# Patient Record
Sex: Female | Born: 1970 | Race: Black or African American | Hispanic: No | Marital: Married | Smoking: Never smoker
Health system: Southern US, Community
[De-identification: ages and names within clinical notes are randomized; demographics above are authoritative.]

## PROBLEM LIST (undated history)

## (undated) DIAGNOSIS — R42 Dizziness and giddiness: Secondary | ICD-10-CM

## (undated) HISTORY — PX: TUBAL LIGATION: SHX77

---

## 1998-09-18 ENCOUNTER — Encounter: Admission: RE | Admit: 1998-09-18 | Discharge: 1998-10-03 | Payer: Self-pay | Admitting: Family Medicine

## 2000-01-11 ENCOUNTER — Emergency Department (HOSPITAL_COMMUNITY): Admission: EM | Admit: 2000-01-11 | Discharge: 2000-01-11 | Payer: Self-pay | Admitting: Emergency Medicine

## 2002-02-19 ENCOUNTER — Encounter: Payer: Self-pay | Admitting: Emergency Medicine

## 2002-02-19 ENCOUNTER — Emergency Department (HOSPITAL_COMMUNITY): Admission: EM | Admit: 2002-02-19 | Discharge: 2002-02-19 | Payer: Self-pay | Admitting: Emergency Medicine

## 2003-06-22 ENCOUNTER — Other Ambulatory Visit: Admission: RE | Admit: 2003-06-22 | Discharge: 2003-06-22 | Payer: Self-pay | Admitting: Family Medicine

## 2003-06-28 ENCOUNTER — Encounter: Admission: RE | Admit: 2003-06-28 | Discharge: 2003-06-28 | Payer: Self-pay | Admitting: Family Medicine

## 2004-03-03 ENCOUNTER — Emergency Department (HOSPITAL_COMMUNITY): Admission: EM | Admit: 2004-03-03 | Discharge: 2004-03-03 | Payer: Self-pay | Admitting: Family Medicine

## 2004-03-29 ENCOUNTER — Encounter: Admission: RE | Admit: 2004-03-29 | Discharge: 2004-03-29 | Payer: Self-pay | Admitting: Family Medicine

## 2004-04-10 ENCOUNTER — Emergency Department (HOSPITAL_COMMUNITY): Admission: EM | Admit: 2004-04-10 | Discharge: 2004-04-11 | Payer: Self-pay | Admitting: Family Medicine

## 2005-04-23 ENCOUNTER — Emergency Department (HOSPITAL_COMMUNITY): Admission: EM | Admit: 2005-04-23 | Discharge: 2005-04-23 | Payer: Self-pay | Admitting: Emergency Medicine

## 2005-11-21 ENCOUNTER — Emergency Department (HOSPITAL_COMMUNITY): Admission: EM | Admit: 2005-11-21 | Discharge: 2005-11-21 | Payer: Self-pay | Admitting: Family Medicine

## 2006-07-23 ENCOUNTER — Emergency Department (HOSPITAL_COMMUNITY): Admission: EM | Admit: 2006-07-23 | Discharge: 2006-07-23 | Payer: Self-pay | Admitting: Family Medicine

## 2011-06-12 ENCOUNTER — Other Ambulatory Visit: Payer: Self-pay | Admitting: Internal Medicine

## 2011-06-18 ENCOUNTER — Ambulatory Visit
Admission: RE | Admit: 2011-06-18 | Discharge: 2011-06-18 | Disposition: A | Discharge status: Home / Self Care | Payer: Self-pay | Source: Ambulatory Visit | Attending: Internal Medicine | Admitting: Internal Medicine

## 2011-06-18 ENCOUNTER — Other Ambulatory Visit: Payer: Self-pay | Admitting: Internal Medicine

## 2011-07-02 ENCOUNTER — Ambulatory Visit
Admission: RE | Admit: 2011-07-02 | Discharge: 2011-07-02 | Disposition: A | Discharge status: Home / Self Care | Payer: Managed Care, Other (non HMO) | Source: Ambulatory Visit | Attending: Internal Medicine | Admitting: Internal Medicine

## 2011-07-02 ENCOUNTER — Other Ambulatory Visit: Payer: Self-pay

## 2011-12-26 ENCOUNTER — Telehealth: Payer: Self-pay | Admitting: "Endocrinology

## 2012-01-13 ENCOUNTER — Encounter (HOSPITAL_COMMUNITY): Payer: Self-pay

## 2012-01-13 ENCOUNTER — Emergency Department (HOSPITAL_COMMUNITY)
Admission: EM | Admit: 2012-01-13 | Discharge: 2012-01-13 | Disposition: A | Discharge status: Home / Self Care | Payer: Managed Care, Other (non HMO) | Source: Home / Self Care | Attending: Emergency Medicine | Admitting: Emergency Medicine

## 2012-01-13 DIAGNOSIS — N3000 Acute cystitis without hematuria: Secondary | ICD-10-CM

## 2012-01-13 LAB — POCT URINALYSIS DIP (DEVICE)
Bilirubin Urine: NEGATIVE
Glucose, UA: NEGATIVE mg/dL
Ketones, ur: NEGATIVE mg/dL
Nitrite: POSITIVE — AB
Protein, ur: 30 mg/dL — AB
Specific Gravity, Urine: 1.025 (ref 1.005–1.030)
Urobilinogen, UA: 0.2 mg/dL (ref 0.0–1.0)
pH: 5.5 (ref 5.0–8.0)

## 2012-01-13 LAB — POCT PREGNANCY, URINE: Preg Test, Ur: NEGATIVE

## 2012-01-13 MED ORDER — CEPHALEXIN 500 MG PO CAPS: 500.0000 mg | ORAL_CAPSULE | Freq: Three times a day (TID) | ORAL | Status: AC

## 2012-01-13 MED ORDER — CEPHALEXIN 500 MG PO CAPS: 500.0000 mg | ORAL_CAPSULE | Freq: Three times a day (TID) | ORAL | Status: DC

## 2012-01-13 NOTE — ED Notes (Addendum)
C/o painful UA for past 24-48 h; noted blood this PM, concerned for poss UTI; denies any concern for STD

## 2012-01-13 NOTE — ED Provider Notes (Signed)
Chief Complaint  Patient presents with  . Dysuria    History of Present Illness:   Bonnie Weber is a 41 year old female who has had a two-day history of frequency, dysuria, and blood in the urine. She denies any fever, chills, nausea, vomiting, back pain, or lower abdominal pain. She denies any GYN complaints. Her last menstrual period was July 27. She is sexually active and has had a tubal ligation. She has had urinary tract infections before, but the last one was about 2 years ago.  Review of Systems:  Other than noted above, the patient denies any of the following symptoms: General:  No fevers, chills, sweats, aches, or fatigue. GI:  No abdominal pain, back pain, nausea, vomiting, diarrhea, or constipation. GU:  No dysuria, frequency, urgency, hematuria, or incontinence. GYN:  No discharge, itching, vulvar pain or lesions, pelvic pain, or abnormal vaginal bleeding.  PMFSH:  Past medical history, family history, social history, meds, and allergies were reviewed.  Physical Exam:   Vital signs:  BP 114/82  Pulse 90  Temp 98.6 F (37 C) (Oral)  Resp 12  SpO2 99% Gen:  Alert, oriented, in no distress. Lungs:  Clear to auscultation, no wheezes, rales or rhonchi. Heart:  Regular rhythm, no gallop or murmer. Abdomen:  Flat and soft. There was slight suprapubic pain to palpation.  No guarding, or rebound.  No hepato-splenomegaly or mass.  Bowel sounds were normally active.  No hernia. Back:  No CVA tenderness.  Skin:  Clear, warm and dry.  Labs:   Results for orders placed during the hospital encounter of 01/13/12  POCT URINALYSIS DIP (DEVICE)      Component Value Range   Glucose, UA NEGATIVE  NEGATIVE mg/dL   Bilirubin Urine NEGATIVE  NEGATIVE   Ketones, ur NEGATIVE  NEGATIVE mg/dL   Specific Gravity, Urine 1.025  1.005 - 1.030   Hgb urine dipstick LARGE (*) NEGATIVE   pH 5.5  5.0 - 8.0   Protein, ur 30 (*) NEGATIVE mg/dL   Urobilinogen, UA 0.2  0.0 - 1.0 mg/dL   Nitrite POSITIVE (*)  NEGATIVE   Leukocytes, UA SMALL (*) NEGATIVE  POCT PREGNANCY, URINE      Component Value Range   Preg Test, Ur NEGATIVE  NEGATIVE     Other Labs Obtained at Urgent Care Center:  A urine culture was obtained.  Results are pending at this time and we will call about any positive results.  Assessment: The encounter diagnosis was Acute cystitis.   Plan:   1.  The following meds were prescribed:   New Prescriptions   CEPHALEXIN (KEFLEX) 500 MG CAPSULE    Take 1 capsule (500 mg total) by mouth 3 (three) times daily.   2.  The patient was instructed in symptomatic care and handouts were given. 3.  The patient was told to return if becoming worse in any way, if no better in 3 or 4 days, and given some red flag symptoms that would indicate earlier return. 4.  The patient was told to avoid intercourse for 10 days, get extra fluids, and return for a follow up with her primary care doctor at the completion of treatment for a repeat UA and culture.     Reuben Likes, MD 01/13/12 2046

## 2012-01-15 LAB — URINE CULTURE: Colony Count: >=100000

## 2012-01-15 NOTE — ED Notes (Signed)
Urine culture: >100,000 colonies E. Coli. Pt. Adequately treated with Keflex. Vassie Moselle 01/15/2012

## 2012-04-06 NOTE — Telephone Encounter (Signed)
Opened in error

## 2013-05-29 ENCOUNTER — Encounter (HOSPITAL_COMMUNITY): Payer: Self-pay | Admitting: Emergency Medicine

## 2013-05-29 ENCOUNTER — Emergency Department (INDEPENDENT_AMBULATORY_CARE_PROVIDER_SITE_OTHER)
Admission: EM | Admit: 2013-05-29 | Discharge: 2013-05-29 | Disposition: A | Discharge status: Home / Self Care | Payer: Managed Care, Other (non HMO) | Source: Home / Self Care | Attending: Family Medicine | Admitting: Family Medicine

## 2013-05-29 DIAGNOSIS — S01512A Laceration without foreign body of oral cavity, initial encounter: Secondary | ICD-10-CM

## 2013-05-29 DIAGNOSIS — S01501A Unspecified open wound of lip, initial encounter: Secondary | ICD-10-CM

## 2013-05-29 NOTE — ED Provider Notes (Signed)
CSN: [id number]     Arrival date & time 05/29/13  1478 History   First MD Initiated Contact with Patient 05/29/13 [phone]     Chief Complaint  Patient presents with  . Groin Swelling   (Consider location/radiation/quality/duration/timing/severity/associated sxs/prior Treatment) HPI Comments: Patient states she had waxing of her vaginal area done yesterday and during procedure, she suffered a small skin tear on the left side of her vaginal area. She noticed some dries blood in the area today and this concerned her so she presents for evaluation.   The history is provided by the patient.    History reviewed. No pertinent past medical history. History reviewed. No pertinent past surgical history. History reviewed. No pertinent family history. History  Substance Use Topics  . Smoking status: Not on file  . Smokeless tobacco: Not on file  . Alcohol Use: Not on file   OB History   Grav Para Term Preterm Abortions TAB SAB Ect Mult Living                 Review of Systems  All other systems reviewed and are negative.    Allergies  Shellfish allergy  Home Medications  No current outpatient prescriptions on file. BP 131/90  Pulse 84  Temp(Src) 99 F (37.2 C) (Oral)  Resp 18  SpO2 18%  LMP 05/15/2013 Physical Exam  Nursing note and vitals reviewed. Constitutional: She is oriented to person, place, and time. She appears well-developed and well-nourished. No distress.  HENT:  Head: Normocephalic and atraumatic.  Cardiovascular: Normal rate.   Pulmonary/Chest: Effort normal.  Genitourinary:    Pelvic exam was performed with patient supine. There is no rash, tenderness, lesion or injury on the right labia. There is tenderness and injury on the left labia. There is no rash or lesion on the left labia.  Neurological: She is alert and oriented to person, place, and time.  Skin: Skin is warm and dry.  Psychiatric: She has a normal mood and affect. Her behavior is normal.    ED  Course  Procedures (including critical care time) Labs Review Labs Reviewed - No data to display Imaging Review No results found.  EKG Interpretation    Date/Time:    Ventricular Rate:    PR Interval:    QRS Duration:   QT Interval:    QTC Calculation:   R Axis:     Text Interpretation:              MDM  Advised to keep area as clean and as dry as possible with application of a very thin layer of bacitracin over would once a day after bathing until healed. If signs of infection (redeness, swelling, increasing pain or drainage) have area re-evaluated. No waxing until healed.     Jess Barters [place], [place] 05/29/13 1002

## 2013-05-29 NOTE — ED Notes (Signed)
C/o vaginal area is bleeding near her left thigh and labia  States she was getting waxed and when it was pulled away she started bleeding Notice this morning she has gashes on her vagina

## 2013-05-30 NOTE — ED Provider Notes (Signed)
Medical screening examination/treatment/procedure(s) were performed by a resident physician or non-physician practitioner and as the supervising physician I was immediately available for consultation/collaboration.  Donat Humble, MD    Roslin Norwood S Kacin Dancy, MD 05/30/13 0744 

## 2015-10-23 ENCOUNTER — Emergency Department (HOSPITAL_BASED_OUTPATIENT_CLINIC_OR_DEPARTMENT_OTHER)
Admission: EM | Admit: 2015-10-23 | Discharge: 2015-10-23 | Disposition: A | Discharge status: Home / Self Care | Payer: BLUE CROSS/BLUE SHIELD | Attending: Emergency Medicine | Admitting: Emergency Medicine

## 2015-10-23 ENCOUNTER — Encounter (HOSPITAL_BASED_OUTPATIENT_CLINIC_OR_DEPARTMENT_OTHER): Payer: Self-pay | Admitting: *Deleted

## 2015-10-23 DIAGNOSIS — R51 Headache: Secondary | ICD-10-CM | POA: Insufficient documentation

## 2015-10-23 DIAGNOSIS — R55 Syncope and collapse: Secondary | ICD-10-CM | POA: Insufficient documentation

## 2015-10-23 DIAGNOSIS — R109 Unspecified abdominal pain: Secondary | ICD-10-CM | POA: Insufficient documentation

## 2015-10-23 LAB — URINALYSIS, ROUTINE W REFLEX MICROSCOPIC
BILIRUBIN URINE: NEGATIVE
GLUCOSE, UA: NEGATIVE mg/dL
HGB URINE DIPSTICK: NEGATIVE
Ketones, ur: 15 mg/dL — AB
Leukocytes, UA: NEGATIVE
Nitrite: NEGATIVE
PH: 7 (ref 5.0–8.0)
Protein, ur: NEGATIVE mg/dL
SPECIFIC GRAVITY, URINE: 1.019 (ref 1.005–1.030)

## 2015-10-23 LAB — BASIC METABOLIC PANEL
Anion gap: 5 (ref 5–15)
BUN: 12 mg/dL (ref 6–20)
CO2: 25 mmol/L (ref 22–32)
Calcium: 8.6 mg/dL — ABNORMAL LOW (ref 8.9–10.3)
Chloride: 108 mmol/L (ref 101–111)
Creatinine, Ser: 0.75 mg/dL (ref 0.44–1.00)
GFR calc Af Amer: >60 mL/min (ref >60)
GFR calc non Af Amer: >60 mL/min (ref >60)
Glucose, Bld: 108 mg/dL — ABNORMAL HIGH (ref 65–99)
Potassium: 3.4 mmol/L — ABNORMAL LOW (ref 3.5–5.1)
Sodium: 138 mmol/L (ref 135–145)

## 2015-10-23 LAB — CBC WITH DIFFERENTIAL/PLATELET
Basophils Absolute: 0 10*3/uL (ref 0.0–0.1)
Basophils Relative: 0 %
Eosinophils Absolute: 0.1 10*3/uL (ref 0.0–0.7)
Eosinophils Relative: 1 %
HCT: 36.6 % (ref 36.0–46.0)
Hemoglobin: 12.5 g/dL (ref 12.0–15.0)
Lymphocytes Relative: 34 %
Lymphs Abs: 1.6 10*3/uL (ref 0.7–4.0)
MCH: 29.1 pg (ref 26.0–34.0)
MCHC: 34.2 g/dL (ref 30.0–36.0)
MCV: 85.3 fL (ref 78.0–100.0)
Monocytes Absolute: 0.3 10*3/uL (ref 0.1–1.0)
Monocytes Relative: 6 %
Neutro Abs: 2.7 10*3/uL (ref 1.7–7.7)
Neutrophils Relative %: 59 %
Platelets: 191 10*3/uL (ref 150–400)
RBC: 4.29 MIL/uL (ref 3.87–5.11)
RDW: 13 % (ref 11.5–15.5)
WBC: 4.7 10*3/uL (ref 4.0–10.5)

## 2015-10-23 LAB — PREGNANCY, URINE: Preg Test, Ur: NEGATIVE

## 2015-10-23 MED ORDER — ONDANSETRON HCL 4 MG/2ML IJ SOLN
4.0000 mg | Freq: Once | INTRAMUSCULAR | Status: AC
  Administered 2015-10-23: 4 mg via INTRAVENOUS

## 2015-10-23 MED ORDER — ONDANSETRON HCL 4 MG/2ML IJ SOLN
INTRAMUSCULAR | Status: AC
  Administered 2015-10-23: 4 mg via INTRAVENOUS
  Filled 2015-10-23: qty 2

## 2015-10-23 MED ORDER — SODIUM CHLORIDE 0.9 % IV BOLUS (SEPSIS)
1000.0000 mL | Freq: Once | INTRAVENOUS | Status: AC
  Administered 2015-10-23: 1000 mL via INTRAVENOUS

## 2015-10-23 NOTE — ED Provider Notes (Signed)
CSN: [id number]     Arrival date & time 10/23/15  [id number] History   First MD Initiated Contact with Patient 10/23/15 [phone]     Chief Complaint  Patient presents with  . Menstrual Problem     (Consider location/radiation/quality/duration/timing/severity/associated sxs/prior Treatment) Patient is a 45 y.o. female presenting with near-syncope.  Near Syncope This is a new problem. The current episode started today. Associated symptoms include abdominal pain (menstrual cramping), fatigue, headaches, nausea, vertigo and vomiting. Pertinent negatives include no chest pain, chills, congestion, coughing, diaphoresis, fever or weakness. The symptoms are aggravated by standing. She has tried nothing for the symptoms.    History reviewed. No pertinent past medical history. Past Surgical History  Procedure Laterality Date  . Tubal ligation     Family History  Problem Relation Age of Onset  . Heart failure Maternal Grandmother    Social History  Substance Use Topics  . Smoking status: Never Smoker   . Smokeless tobacco: None  . Alcohol Use: None   OB History    No data available     Review of Systems  Constitutional: Positive for fatigue. Negative for fever, chills and diaphoresis.  HENT: Negative for congestion.   Respiratory: Negative for cough, chest tightness and shortness of breath.   Cardiovascular: Positive for near-syncope. Negative for chest pain and palpitations.  Gastrointestinal: Positive for nausea, vomiting and abdominal pain (menstrual cramping).  Neurological: Positive for vertigo and headaches. Negative for seizures, syncope, facial asymmetry, speech difficulty and weakness.  Psychiatric/Behavioral: Negative for confusion.      Allergies  Shellfish allergy  Home Medications   Prior to Admission medications   Not on File   BP 122/93 mmHg  Pulse 74  Temp(Src) 98 F (36.7 C) (Oral)  Resp 16  Ht 5\' 2"  (1.575 m)  Wt 70.308 kg  BMI 28.34 kg/m2  SpO2 100%  LMP  10/01/2015 Physical Exam  Constitutional: She is oriented to person, place, and time. She appears well-developed and well-nourished. No distress.  HENT:  Head: Normocephalic.  Mouth/Throat: Mucous membranes are dry.  Eyes: Conjunctivae and EOM are normal. Pupils are equal, round, and reactive to light.  Neck: Normal range of motion. Neck supple.  Cardiovascular: Normal rate, regular rhythm and S1 normal.   No murmur heard. Fixed split S2  Pulmonary/Chest: Effort normal and breath sounds normal. No respiratory distress. She has no wheezes. She has no rales. She exhibits no tenderness.  Abdominal: Soft. Bowel sounds are normal. She exhibits no distension and no mass. There is tenderness (mild). There is no rebound and no guarding.  Musculoskeletal: Normal range of motion. She exhibits no edema or tenderness.  Lymphadenopathy:    She has no cervical adenopathy.  Neurological: She is alert and oriented to person, place, and time. No cranial nerve deficit. Coordination normal.  Skin: Skin is warm and dry. She is not diaphoretic.    ED Course  Procedures (including critical care time) Labs Review Labs Reviewed  BASIC METABOLIC PANEL - Abnormal; Notable for the following:    Potassium 3.4 (*)    Glucose, Bld 108 (*)    Calcium 8.6 (*)    All other components within normal limits  URINALYSIS, ROUTINE W REFLEX MICROSCOPIC (NOT AT [facility]) - Abnormal; Notable for the following:    Ketones, ur 15 (*)    All other components within normal limits  CBC WITH DIFFERENTIAL/PLATELET  PREGNANCY, URINE    Imaging Review No results found. I have personally reviewed and evaluated these images  and lab results as part of my medical decision-making.   EKG Interpretation   Date/Time:  Tuesday Oct 23 2015 09:44:20 EDT Ventricular Rate:  74 PR Interval:  159 QRS Duration: 84 QT Interval:  396 QTC Calculation: 439 R Axis:   59 Text Interpretation:  Sinus rhythm Normal ECG Confirmed by DELO  MD,   DOUGLAS ([id number]) on 10/23/2015 9:52:04 AM      Orthostatic VS for the past 24 hrs:  BP- Lying Pulse- Lying BP- Sitting Pulse- Sitting BP- Standing at 0 minutes Pulse- Standing at 0 minutes  10/23/15 1023 (!) 132/100 mmHg 67 (!) 133/105 mmHg 73 (!) 135/102 mmHg 78      MDM   Final diagnoses:  Near syncope    Patient improved with 1L bolus of NS. Patient steady on her feet ambulating to the bathroom for a urine sample. Orthostatic vitals negative. EKG unremarkable for arrhythmia, no events on telemetry. Physical exam remarkable only for dry mucous membranes. Labs unremarkable for anything significant (slightly low potassium s/p episode of emesis and slight dehydration). Will discharge home. Red flags for return were discussed. Patient agrees with plan.    Narda Bonds, MD 10/23/15 1436  Geoffery Lyons, MD 10/24/15 2144

## 2015-10-23 NOTE — ED Notes (Signed)
Pt reports her menstrual cycle is 2 weeks early, pt reports "really light flow" has used one tampon since this am, but more cramping than usual, rates at 5/10. Pt states she got nauseous and had one episode of emesis this am, and when she stood up quickly she felt dizzy. Pt states she cont to feel "a little dizzy".

## 2015-10-23 NOTE — ED Notes (Signed)
While obtaining orthostatic vitals, when patient stood up patient was a little unsteady on her feet, was able to obtain BP which is noted below. Last few vitals BP has been high RN Amy made aware.

## 2015-10-23 NOTE — Discharge Instructions (Signed)
We evaluated you for your dizziness and fall. This is most likely caused by low blood volume as you were dehydrated. Your EKG did not show any arrhythmia. Please stay well hydrated. Your blood pressure was a little elevated. Please follow-up with your primary care physician to have this reevaluated in the clinic. If you have symptoms of worsening headache, loss of consciousness, funny heart beats, chest pain, shortness of breath, please return to the emergency department for reevaluation.

## 2015-11-08 ENCOUNTER — Encounter (HOSPITAL_COMMUNITY): Payer: Self-pay | Admitting: Emergency Medicine

## 2015-11-08 ENCOUNTER — Ambulatory Visit (HOSPITAL_COMMUNITY)
Admission: EM | Admit: 2015-11-08 | Discharge: 2015-11-08 | Disposition: A | Discharge status: Home / Self Care | Payer: BLUE CROSS/BLUE SHIELD | Attending: Family Medicine | Admitting: Family Medicine

## 2015-11-08 DIAGNOSIS — H8110 Benign paroxysmal vertigo, unspecified ear: Secondary | ICD-10-CM

## 2015-11-08 MED ORDER — MECLIZINE HCL 12.5 MG PO TABS: 12.5000 mg | ORAL_TABLET | Freq: Three times a day (TID) | ORAL | Status: AC | PRN

## 2015-11-08 NOTE — Discharge Instructions (Signed)
Benign Positional Vertigo Vertigo is the feeling that you or your surroundings are moving when they are not. Benign positional vertigo is the most common form of vertigo. The cause of this condition is not serious (is benign). This condition is triggered by certain movements and positions (is positional). This condition can be dangerous if it occurs while you are doing something that could endanger you or others, such as driving.  CAUSES In many cases, the cause of this condition is not known. It may be caused by a disturbance in an area of the inner ear that helps your brain to sense movement and balance. This disturbance can be caused by a viral infection (labyrinthitis), head injury, or repetitive motion. RISK FACTORS This condition is more likely to develop in: 1. Women. 2. People who are 50 years of age or older. SYMPTOMS Symptoms of this condition usually happen when you move your head or your eyes in different directions. Symptoms may start suddenly, and they usually last for less than a minute. Symptoms may include:  Loss of balance and falling.  Feeling like you are spinning or moving.  Feeling like your surroundings are spinning or moving.  Nausea and vomiting.  Blurred vision.  Dizziness.  Involuntary eye movement (nystagmus). Symptoms can be mild and cause only slight annoyance, or they can be severe and interfere with daily life. Episodes of benign positional vertigo may return (recur) over time, and they may be triggered by certain movements. Symptoms may improve over time. DIAGNOSIS This condition is usually diagnosed by medical history and a physical exam of the head, neck, and ears. You may be referred to a health care provider who specializes in ear, nose, and throat (ENT) problems (otolaryngologist) or a provider who specializes in disorders of the nervous system (neurologist). You may have additional testing, including:  MRI.  A CT scan.  Eye movement tests. Your  health care provider may ask you to change positions quickly while he or she watches you for symptoms of benign positional vertigo, such as nystagmus. Eye movement may be tested with an electronystagmogram (ENG), caloric stimulation, the Dix-Hallpike test, or the roll test.  An electroencephalogram (EEG). This records electrical activity in your brain.  Hearing tests. TREATMENT Usually, your health care provider will treat this by moving your head in specific positions to adjust your inner ear back to normal. Surgery may be needed in severe cases, but this is rare. In some cases, benign positional vertigo may resolve on its own in 2-4 weeks. HOME CARE INSTRUCTIONS Safety  Move slowly.Avoid sudden body or head movements.  Avoid driving.  Avoid operating heavy machinery.  Avoid doing any tasks that would be dangerous to you or others if a vertigo episode would occur.  If you have trouble walking or keeping your balance, try using a cane for stability. If you feel dizzy or unstable, sit down right away.  Return to your normal activities as told by your health care provider. Ask your health care provider what activities are safe for you. General Instructions  Take over-the-counter and prescription medicines only as told by your health care provider.  Avoid certain positions or movements as told by your health care provider.  Drink enough fluid to keep your urine clear or pale yellow.  Keep all follow-up visits as told by your health care provider. This is important. SEEK MEDICAL CARE IF:  You have a fever.  Your condition gets worse or you develop new symptoms.  Your family or friends   notice any behavioral changes.  Your nausea or vomiting gets worse.  You have numbness or a "pins and needles" sensation. SEEK IMMEDIATE MEDICAL CARE IF:  You have difficulty speaking or moving.  You are always dizzy.  You faint.  You develop severe headaches.  You have weakness in your  legs or arms.  You have changes in your hearing or vision.  You develop a stiff neck.  You develop sensitivity to light.   This information is not intended to replace advice given to you by your health care provider. Make sure you discuss any questions you have with your health care provider.   Document Released: 03/03/2006 Document Revised: 02/14/2015 Document Reviewed: 09/18/2014 Elsevier Interactive Patient Education 2016 [employer] Epley Maneuver Self-Care WHAT IS THE EPLEY MANEUVER? The Epley maneuver is an exercise you can do to relieve symptoms of benign paroxysmal positional vertigo (BPPV). This condition is often just referred to as vertigo. BPPV is caused by the movement of tiny crystals (canaliths) inside your inner ear. The accumulation and movement of canaliths in your inner ear causes a sudden spinning sensation (vertigo) when you move your head to certain positions. Vertigo usually lasts about 30 seconds. BPPV usually occurs in just one ear. If you get vertigo when you lie on your left side, you probably have BPPV in your left ear. Your health care provider can tell you which ear is involved.  BPPV may be caused by a head injury. Many people older than 50 get BPPV for unknown reasons. If you have been diagnosed with BPPV, your health care provider may teach you how to do this maneuver. BPPV is not life threatening (benign) and usually goes away in time.  WHEN SHOULD I PERFORM THE EPLEY MANEUVER? You can do this maneuver at home whenever you have symptoms of vertigo. You may do the Epley maneuver up to 3 times a day until your symptoms of vertigo go away. HOW SHOULD I DO THE EPLEY MANEUVER? 3. Sit on the edge of a bed or table with your back straight. Your legs should be extended or hanging over the edge of the bed or table.  4. Turn your head halfway toward the affected ear.  5. Lie backward quickly with your head turned until you are lying flat on your back. You may want to  position a pillow under your shoulders.  6. Hold this position for 30 seconds. You may experience an attack of vertigo. This is normal. Hold this position until the vertigo stops. 7. Then turn your head to the opposite direction until your unaffected ear is facing the floor.  8. Hold this position for 30 seconds. You may experience an attack of vertigo. This is normal. Hold this position until the vertigo stops. 9. Now turn your whole body to the same side as your head. Hold for another 30 seconds.  10. You can then sit back up. ARE THERE RISKS TO THIS MANEUVER? In some cases, you may have other symptoms (such as changes in your vision, weakness, or numbness). If you have these symptoms, stop doing the maneuver and call your health care provider. Even if doing these maneuvers relieves your vertigo, you may still have dizziness. Dizziness is the sensation of light-headedness but without the sensation of movement. Even though the Epley maneuver may relieve your vertigo, it is possible that your symptoms will return within 5 years. WHAT SHOULD I DO AFTER THIS MANEUVER? After doing the Epley maneuver, you can return to your normal   activities. Ask your doctor if there is anything you should do at home to prevent vertigo. This may include:  Sleeping with two or more pillows to keep your head elevated.  Not sleeping on the side of your affected ear.  Getting up slowly from bed.  Avoiding sudden movements during the day.  Avoiding extreme head movement, like looking up or bending over.  Wearing a cervical collar to prevent sudden head movements. WHAT SHOULD I DO IF MY SYMPTOMS GET WORSE? Call your health care provider if your vertigo gets worse. Call your provider right way if you have other symptoms, including:   Nausea.  Vomiting.  Headache.  Weakness.  Numbness.  Vision changes.   This information is not intended to replace advice given to you by your health care provider. Make  sure you discuss any questions you have with your health care provider.   Document Released: 05/31/2013 Document Reviewed: 05/31/2013 Elsevier Interactive Patient Education 2016 [employer] Inc[employer]  

## 2015-11-08 NOTE — ED Notes (Signed)
Pt has been suffering from dizziness since May 15.  She had an episode of dizziness and a fall at work and was taken to the ED.  Since then the pt has continued to have dizziness and now it is accompanied by nausea. Her PCP has moved and she cannot get in to see another MD until August.
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
## 2016-04-07 ENCOUNTER — Other Ambulatory Visit: Payer: Self-pay | Admitting: Family Medicine

## 2016-04-07 DIAGNOSIS — Z1231 Encounter for screening mammogram for malignant neoplasm of breast: Secondary | ICD-10-CM

## 2016-06-06 ENCOUNTER — Other Ambulatory Visit: Payer: Self-pay | Admitting: Family Medicine

## 2016-06-06 DIAGNOSIS — Z1231 Encounter for screening mammogram for malignant neoplasm of breast: Secondary | ICD-10-CM

## 2016-07-14 ENCOUNTER — Encounter: Payer: Self-pay | Admitting: Radiology

## 2016-07-14 ENCOUNTER — Ambulatory Visit
Admission: RE | Admit: 2016-07-14 | Discharge: 2016-07-14 | Disposition: A | Discharge status: Home / Self Care | Payer: BLUE CROSS/BLUE SHIELD | Source: Ambulatory Visit | Attending: Family Medicine | Admitting: Family Medicine

## 2016-07-14 DIAGNOSIS — Z1231 Encounter for screening mammogram for malignant neoplasm of breast: Secondary | ICD-10-CM

## 2016-09-13 ENCOUNTER — Emergency Department (HOSPITAL_BASED_OUTPATIENT_CLINIC_OR_DEPARTMENT_OTHER)
Admission: EM | Admit: 2016-09-13 | Discharge: 2016-09-13 | Disposition: A | Discharge status: Home / Self Care | Payer: BLUE CROSS/BLUE SHIELD | Attending: Emergency Medicine | Admitting: Emergency Medicine

## 2016-09-13 ENCOUNTER — Encounter (HOSPITAL_BASED_OUTPATIENT_CLINIC_OR_DEPARTMENT_OTHER): Payer: Self-pay | Admitting: Emergency Medicine

## 2016-09-13 DIAGNOSIS — Y939 Activity, unspecified: Secondary | ICD-10-CM | POA: Insufficient documentation

## 2016-09-13 DIAGNOSIS — R51 Headache: Secondary | ICD-10-CM | POA: Diagnosis not present

## 2016-09-13 DIAGNOSIS — M549 Dorsalgia, unspecified: Secondary | ICD-10-CM | POA: Insufficient documentation

## 2016-09-13 DIAGNOSIS — Y999 Unspecified external cause status: Secondary | ICD-10-CM | POA: Insufficient documentation

## 2016-09-13 DIAGNOSIS — Y92410 Unspecified street and highway as the place of occurrence of the external cause: Secondary | ICD-10-CM | POA: Diagnosis not present

## 2016-09-13 DIAGNOSIS — S3992XA Unspecified injury of lower back, initial encounter: Secondary | ICD-10-CM | POA: Diagnosis present

## 2016-09-13 MED ORDER — IBUPROFEN 800 MG PO TABS
800.0000 mg | ORAL_TABLET | Freq: Once | ORAL | Status: AC
Start: 2016-09-13 — End: 2016-09-13
  Administered 2016-09-13: 800 mg via ORAL
  Filled 2016-09-13: qty 1

## 2016-09-13 MED ORDER — CYCLOBENZAPRINE HCL 5 MG PO TABS: 5.0000 mg | ORAL_TABLET | Freq: Three times a day (TID) | ORAL | 0 refills | Status: AC | PRN

## 2016-09-13 MED ORDER — NAPROXEN 500 MG PO TABS: 500.0000 mg | ORAL_TABLET | Freq: Two times a day (BID) | ORAL | 0 refills | Status: AC

## 2016-09-13 NOTE — Discharge Instructions (Signed)
Return to the ED with any concerns including chest pain, abdominal pain, weakness in arms or legs, vomiting, seizure activity, decreased level of alertness/lethargy, or any other alarming symptoms

## 2016-09-13 NOTE — ED Notes (Signed)
Pt ambulatory without assistance.  
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
## 2017-01-18 ENCOUNTER — Emergency Department (HOSPITAL_BASED_OUTPATIENT_CLINIC_OR_DEPARTMENT_OTHER)
Admission: EM | Admit: 2017-01-18 | Discharge: 2017-01-18 | Disposition: A | Discharge status: Home / Self Care | Payer: BLUE CROSS/BLUE SHIELD | Attending: Emergency Medicine | Admitting: Emergency Medicine

## 2017-01-18 ENCOUNTER — Encounter (HOSPITAL_BASED_OUTPATIENT_CLINIC_OR_DEPARTMENT_OTHER): Payer: Self-pay | Admitting: *Deleted

## 2017-01-18 DIAGNOSIS — Y92410 Unspecified street and highway as the place of occurrence of the external cause: Secondary | ICD-10-CM | POA: Diagnosis not present

## 2017-01-18 DIAGNOSIS — Y999 Unspecified external cause status: Secondary | ICD-10-CM | POA: Insufficient documentation

## 2017-01-18 DIAGNOSIS — Z79899 Other long term (current) drug therapy: Secondary | ICD-10-CM | POA: Insufficient documentation

## 2017-01-18 DIAGNOSIS — M62830 Muscle spasm of back: Secondary | ICD-10-CM | POA: Insufficient documentation

## 2017-01-18 DIAGNOSIS — M545 Low back pain, unspecified: Secondary | ICD-10-CM

## 2017-01-18 DIAGNOSIS — Y939 Activity, unspecified: Secondary | ICD-10-CM | POA: Insufficient documentation

## 2017-01-18 DIAGNOSIS — M549 Dorsalgia, unspecified: Secondary | ICD-10-CM | POA: Diagnosis present

## 2017-01-18 DIAGNOSIS — M62838 Other muscle spasm: Secondary | ICD-10-CM

## 2017-01-18 HISTORY — DX: Dizziness and giddiness: R42

## 2017-01-18 MED ORDER — HYDROCODONE-ACETAMINOPHEN 5-325 MG PO TABS: 1.0000 | ORAL_TABLET | Freq: Four times a day (QID) | ORAL | 0 refills | Status: AC | PRN
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
## 2017-01-18 NOTE — Discharge Instructions (Signed)
Please take Ibuprofen (Advil, motrin) and Tylenol (acetaminophen) to relieve your pain.  You may take up to 600 MG (3 pills) of normal strength ibuprofen every 8 hours as needed.  In between doses of ibuprofen you make take tylenol, up to 1,000 mg (two extra strength pills).  Do not take more than 3,000 mg tylenol in a 24 hour period.  Your prescription pain medication contains Tylenol which you need to count in your daily allowance of Tylenol. Please check all medication labels as many medications such as pain and cold medications may contain tylenol.  Do not drink alcohol while taking these medications.  Do not take other NSAID'S while taking ibuprofen (such as aleve or naproxen).  Please take ibuprofen with food to decrease stomach upset.  Please take your home diazepam. While it was originally prescribed for your vertigo it is also a very effective muscle relaxer.  Please do not drive, operate heavy machinery, or perform tasks that would be dangerous to you or someone else if he were to fall asleep or make for decision while taking this medication.  You are being prescribed a medication which may make you sleepy. For 24 hours after one dose please do not drive, operate heavy machinery, care for a small child with out another adult present, or perform any activities that may cause harm to you or someone else if you were to fall asleep or be impaired.   Please make sure you stay up and moving around during the day. Please do not plan on the couch or in bed all day as this will increase your muscle pain.
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
## 2017-07-13 ENCOUNTER — Other Ambulatory Visit: Payer: Self-pay | Admitting: Family Medicine

## 2017-07-13 DIAGNOSIS — Z1231 Encounter for screening mammogram for malignant neoplasm of breast: Secondary | ICD-10-CM

## 2017-08-10 ENCOUNTER — Ambulatory Visit
Admission: RE | Admit: 2017-08-10 | Discharge: 2017-08-10 | Disposition: A | Discharge status: Home / Self Care | Payer: BLUE CROSS/BLUE SHIELD | Source: Ambulatory Visit | Attending: Family Medicine | Admitting: Family Medicine

## 2017-08-10 DIAGNOSIS — Z1231 Encounter for screening mammogram for malignant neoplasm of breast: Secondary | ICD-10-CM

## 2019-07-25 ENCOUNTER — Other Ambulatory Visit: Payer: Self-pay | Admitting: Family Medicine

## 2019-07-25 DIAGNOSIS — Z1231 Encounter for screening mammogram for malignant neoplasm of breast: Secondary | ICD-10-CM

## 2019-07-26 ENCOUNTER — Other Ambulatory Visit: Payer: Self-pay

## 2019-07-26 ENCOUNTER — Ambulatory Visit
Admission: RE | Admit: 2019-07-26 | Discharge: 2019-07-26 | Disposition: A | Discharge status: Home / Self Care | Payer: BC Managed Care – PPO | Source: Ambulatory Visit | Attending: Family Medicine | Admitting: Family Medicine

## 2019-07-26 DIAGNOSIS — Z1231 Encounter for screening mammogram for malignant neoplasm of breast: Secondary | ICD-10-CM

## 2021-07-17 IMAGING — MG DIGITAL SCREENING BILAT W/ TOMO W/ CAD
6 of 10 series · 6 of 30 positions shown · non-contrast
Comparison: Previous exam(s).

CLINICAL DATA: Screening.

EXAM:
DIGITAL SCREENING BILATERAL MAMMOGRAM WITH TOMO AND CAD

[L CC synth-2D (1 of 2)]
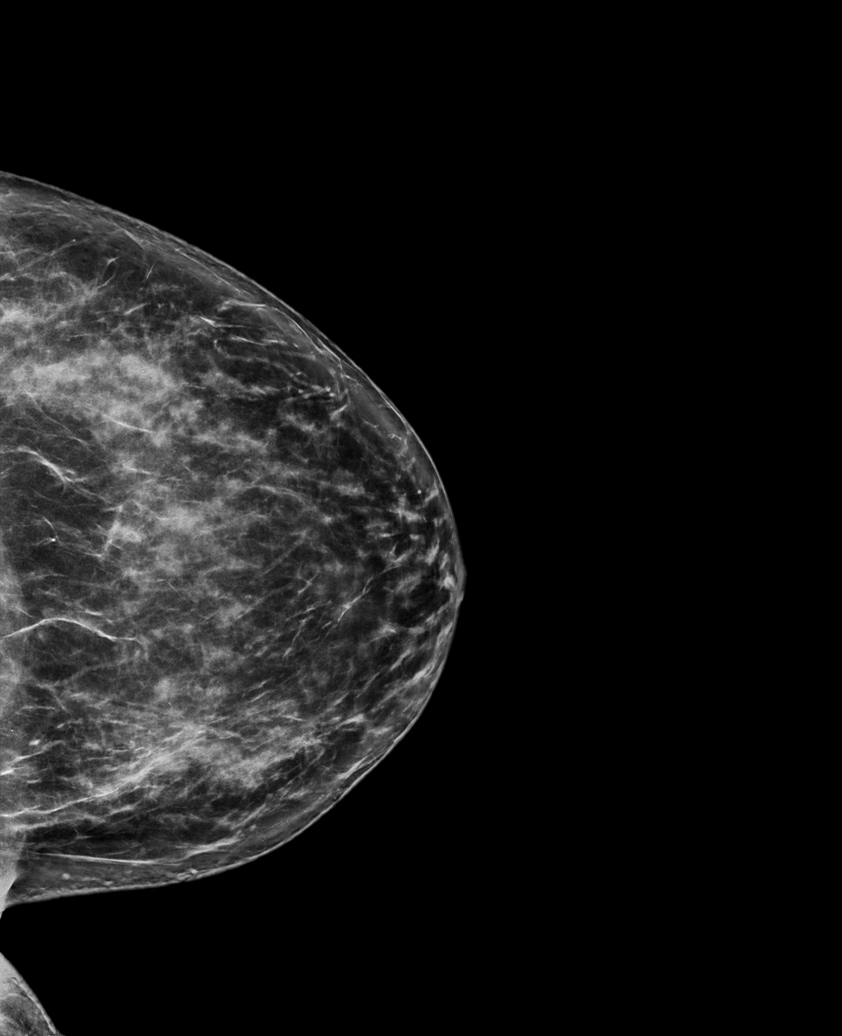

[R CC synth-2D]
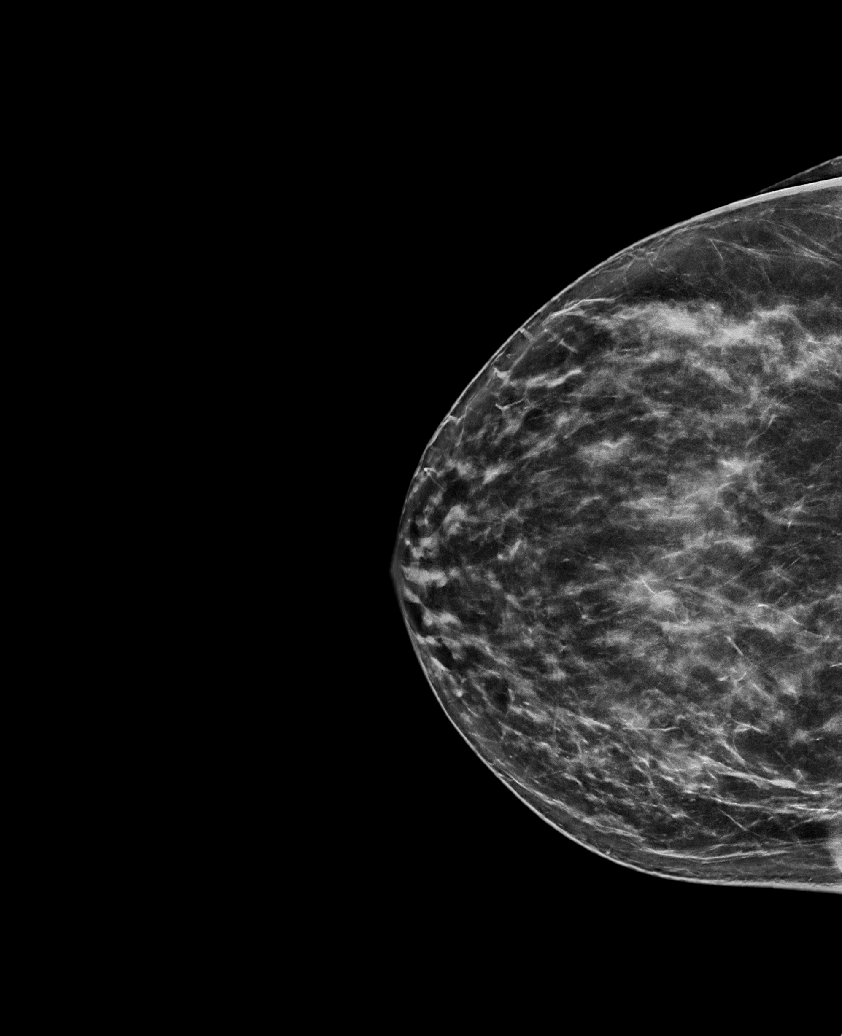

[R MLO synth-2D]
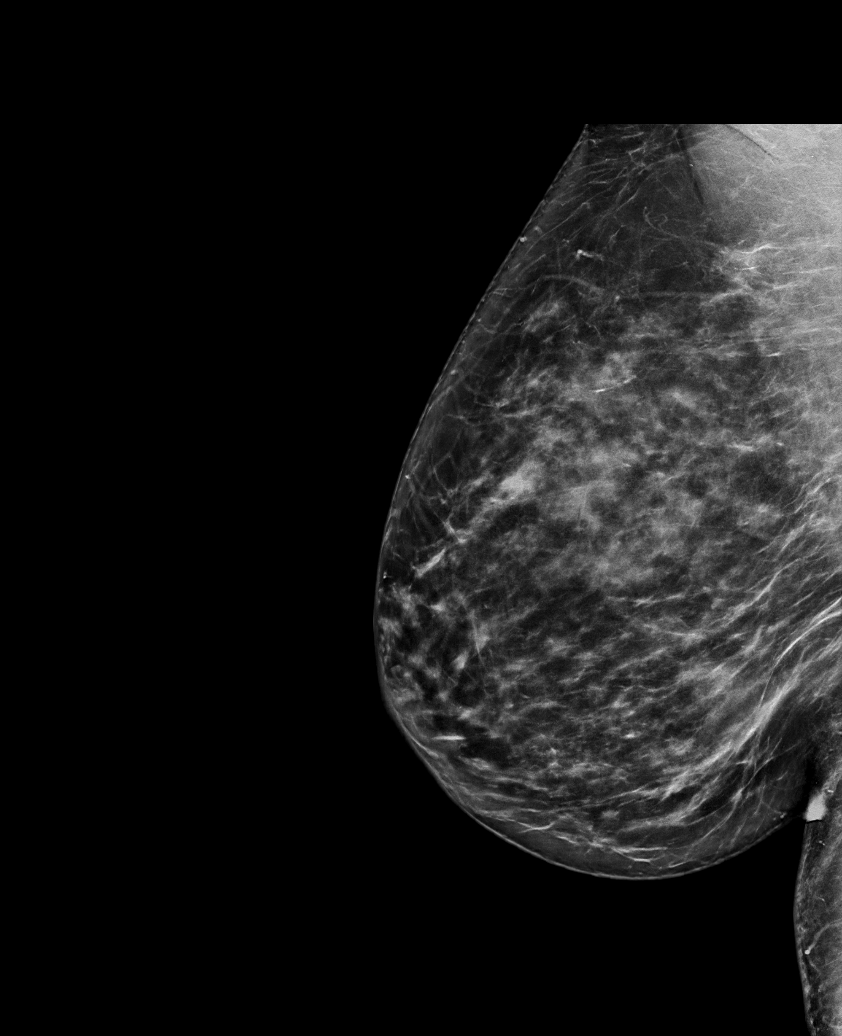

[L CC synth-2D (2 of 2)]
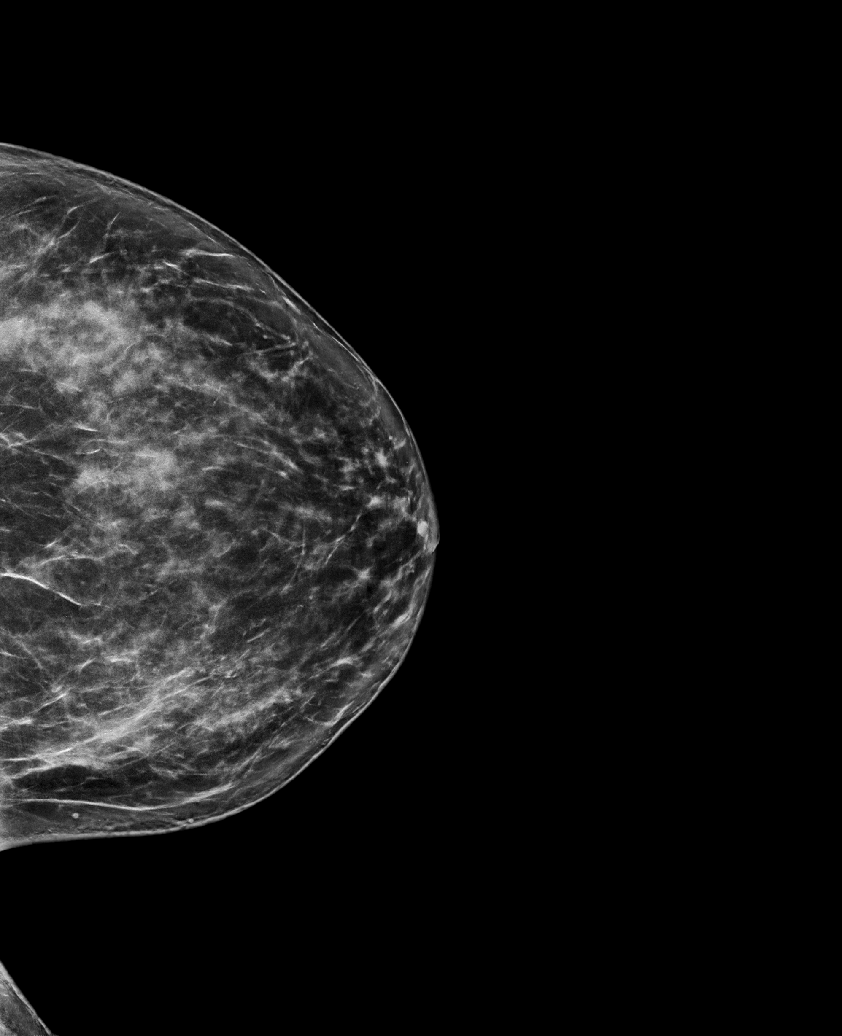

[L MLO synth-2D]
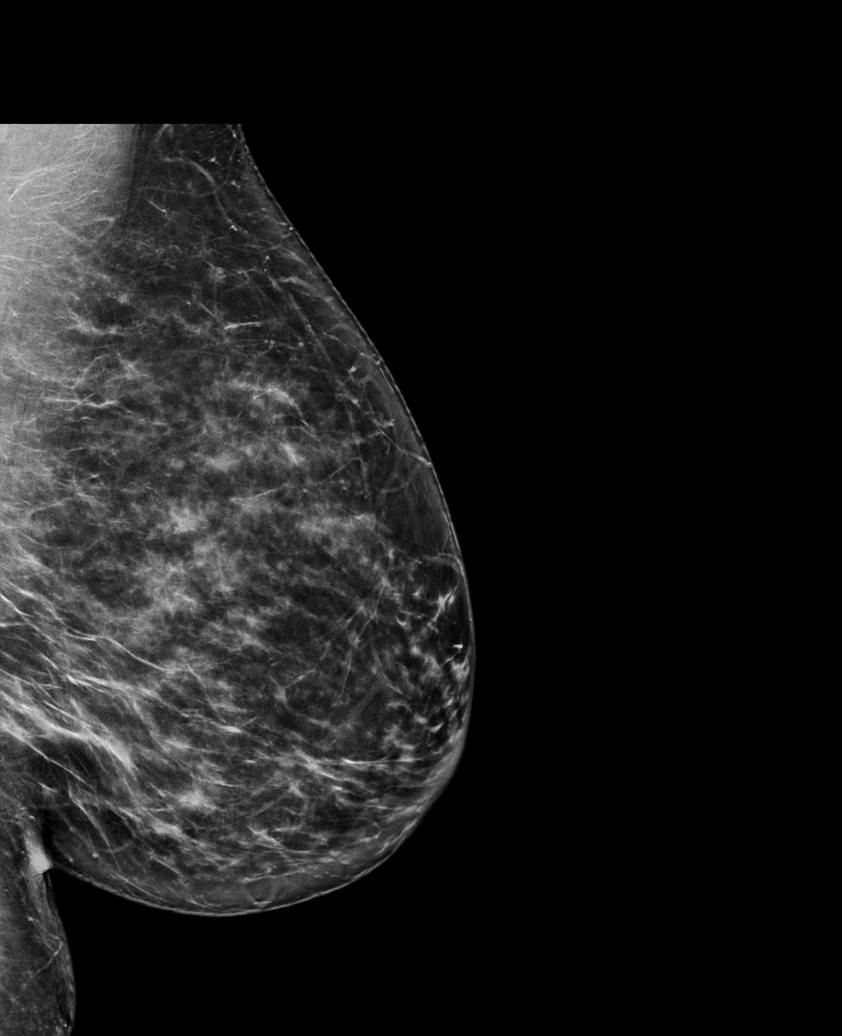

[R MLO tomo · tomo slice 41/81.0]
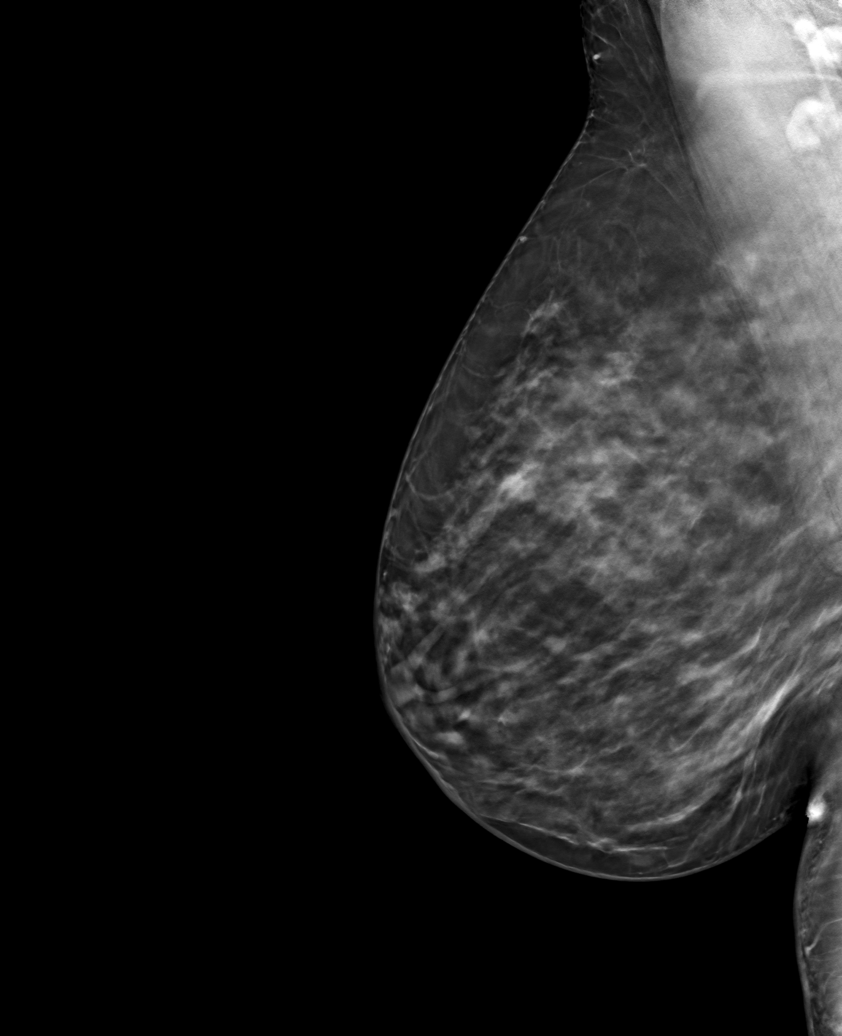

[6 of 30 positions shown; findings below may reference images not displayed]

ACR Breast Density Category d: The breast tissue is extremely dense,
which lowers the sensitivity of mammography
FINDINGS: There are no findings suspicious for malignancy. Images were
processed with CAD.
IMPRESSION: No mammographic evidence of malignancy. A result letter of this
screening mammogram will be mailed directly to the patient.

RECOMMENDATION:
Screening mammogram in one year. (Code:WO-[id number])

BI-RADS CATEGORY  1: Negative.

## 2023-04-01 ENCOUNTER — Encounter (HOSPITAL_BASED_OUTPATIENT_CLINIC_OR_DEPARTMENT_OTHER): Payer: Self-pay | Admitting: Emergency Medicine

## 2023-04-01 ENCOUNTER — Emergency Department (HOSPITAL_BASED_OUTPATIENT_CLINIC_OR_DEPARTMENT_OTHER)
Admission: EM | Admit: 2023-04-01 | Discharge: 2023-04-01 | Disposition: A | Discharge status: Home / Self Care | Payer: BC Managed Care – PPO | Attending: Emergency Medicine | Admitting: Emergency Medicine

## 2023-04-01 ENCOUNTER — Emergency Department (HOSPITAL_BASED_OUTPATIENT_CLINIC_OR_DEPARTMENT_OTHER): Payer: BC Managed Care – PPO

## 2023-04-01 DIAGNOSIS — K5900 Constipation, unspecified: Secondary | ICD-10-CM | POA: Insufficient documentation

## 2023-04-01 DIAGNOSIS — I10 Essential (primary) hypertension: Secondary | ICD-10-CM | POA: Insufficient documentation

## 2023-04-01 DIAGNOSIS — Z79899 Other long term (current) drug therapy: Secondary | ICD-10-CM | POA: Diagnosis not present

## 2023-04-01 LAB — COMPREHENSIVE METABOLIC PANEL
ALT: 23 U/L (ref 0–44)
AST: 15 U/L (ref 15–41)
Albumin: 3.8 g/dL (ref 3.5–5.0)
Alkaline Phosphatase: 80 U/L (ref 38–126)
Anion gap: 12 (ref 5–15)
BUN: 12 mg/dL (ref 6–20)
CO2: 27 mmol/L (ref 22–32)
Calcium: 9.2 mg/dL (ref 8.9–10.3)
Chloride: 100 mmol/L (ref 98–111)
Creatinine, Ser: 1.04 mg/dL — ABNORMAL HIGH (ref 0.44–1.00)
GFR, Estimated: >60 mL/min (ref >60)
Glucose, Bld: 143 mg/dL — ABNORMAL HIGH (ref 70–99)
Potassium: 3.3 mmol/L — ABNORMAL LOW (ref 3.5–5.1)
Sodium: 139 mmol/L (ref 135–145)
Total Bilirubin: 1.7 mg/dL — ABNORMAL HIGH (ref 0.3–1.2)
Total Protein: 7.6 g/dL (ref 6.5–8.1)

## 2023-04-01 LAB — CBC WITH DIFFERENTIAL/PLATELET
Abs Immature Granulocytes: 0.01 10*3/uL (ref 0.00–0.07)
Basophils Absolute: 0 10*3/uL (ref 0.0–0.1)
Basophils Relative: 1 %
Eosinophils Absolute: 0.1 10*3/uL (ref 0.0–0.5)
Eosinophils Relative: 2 %
HCT: 41.9 % (ref 36.0–46.0)
Hemoglobin: 13.8 g/dL (ref 12.0–15.0)
Immature Granulocytes: 0 %
Lymphocytes Relative: 31 %
Lymphs Abs: 1.4 10*3/uL (ref 0.7–4.0)
MCH: 28.3 pg (ref 26.0–34.0)
MCHC: 32.9 g/dL (ref 30.0–36.0)
MCV: 86 fL (ref 80.0–100.0)
Monocytes Absolute: 0.4 10*3/uL (ref 0.1–1.0)
Monocytes Relative: 9 %
Neutro Abs: 2.7 10*3/uL (ref 1.7–7.7)
Neutrophils Relative %: 57 %
Platelets: 222 10*3/uL (ref 150–400)
RBC: 4.87 MIL/uL (ref 3.87–5.11)
RDW: 13.2 % (ref 11.5–15.5)
WBC: 4.6 10*3/uL (ref 4.0–10.5)
nRBC: 0 % (ref 0.0–0.2)

## 2023-04-01 LAB — URINALYSIS, ROUTINE W REFLEX MICROSCOPIC
Bilirubin Urine: NEGATIVE
Glucose, UA: NEGATIVE mg/dL
Hgb urine dipstick: NEGATIVE
Ketones, ur: 15 mg/dL — AB
Leukocytes,Ua: NEGATIVE
Nitrite: NEGATIVE
Protein, ur: NEGATIVE mg/dL
Specific Gravity, Urine: 1.025 (ref 1.005–1.030)
pH: 5.5 (ref 5.0–8.0)

## 2023-04-01 LAB — LIPASE, BLOOD: Lipase: 24 U/L (ref 11–51)

## 2023-04-01 MED ORDER — POLYETHYLENE GLYCOL 3350 17 G PO PACK: 17.0000 g | PACK | Freq: Two times a day (BID) | ORAL | 0 refills | Status: AC

## 2023-04-01 MED ORDER — IOHEXOL 300 MG/ML  SOLN
100.0000 mL | Freq: Once | INTRAMUSCULAR | Status: AC | PRN
  Administered 2023-04-01: 100 mL via INTRAVENOUS

## 2023-04-01 MED ORDER — BISACODYL 10 MG RE SUPP: 10.0000 mg | RECTAL | 0 refills | Status: AC | PRN

## 2023-04-01 NOTE — Discharge Instructions (Signed)
You were seen for your constipation in the emergency department.   At home, please take MiraLAX twice a day and use the suppositories if you not have a bowel movement within the next 48 hours.    Check your MyChart online for the results of any tests that had not resulted by the time you left the emergency department.   Follow-up with your primary doctor in 2-3 days regarding your visit.    Return immediately to the emergency department if you experience any of the following: Worsening pain, vomiting, or any other concerning symptoms.    Thank you for visiting our Emergency Department. It was a pleasure taking care of you today.

## 2023-04-01 NOTE — ED Triage Notes (Signed)
Pt states thinks is constipated, has tried OTC meds feels swollen and tightness. Started Saturday.

## 2023-04-01 NOTE — ED Provider Notes (Signed)
[place] [facility] Provider Note   CSN: [id number] Arrival date & time: 04/01/23  4098     History  Chief Complaint  Patient presents with   Constipation    Bonnie Weber is a 52 y.o. female.  52 year old female with history of hypertension and tubal ligation who presents to the emergency department with abdominal pain and constipation.  Reports that on Saturday after eating some food she had a bowel movement that was followed by multiple episodes of liquid stool.  Since then has not had a bowel movement and has not been passing gas.  Says that she is having right-sided mid and lower abdominal pain that is 5/10 in severity.  Does not radiate.  Worsened with eating food.  Is not passing gas and feels like her abdomen is swollen.  No surgeries aside from her tubal ligation.  No fevers, nausea, or vomiting.       Home Medications Prior to Admission medications   Medication Sig Start Date End Date Taking? Authorizing Provider  bisacodyl (DULCOLAX) 10 MG suppository Place 1 suppository (10 mg total) rectally as needed for moderate constipation. 04/01/23  Yes Rondel Baton, MD  losartan (COZAAR) 50 MG tablet Take 50 mg by mouth daily. 03/23/23  Yes [provider]  polyethylene glycol (MIRALAX) 17 g packet Take 17 g by mouth 2 (two) times daily. 04/01/23  Yes Rondel Baton, MD  cetirizine (ZYRTEC) 10 MG chewable tablet Chew 10 mg by mouth daily.    [provider]  cyclobenzaprine (FLEXERIL) 5 MG tablet Take 1 tablet (5 mg total) by mouth 3 (three) times daily as needed for muscle spasms. 09/13/16   Mabe, Latanya Maudlin, MD  diazepam (VALIUM) 2 MG tablet Take 2 mg by mouth every 12 (twelve) hours as needed for anxiety (takes 1/2 tabs).    [provider]  HYDROcodone-acetaminophen (NORCO/VICODIN) 5-325 MG tablet Take 1 tablet by mouth every 6 (six) hours as needed for severe pain. 01/18/17   Cristina Gong,  PA-C  Loratadine-Pseudoephedrine (CLARITIN-D 12 HOUR PO) Take by mouth.    [provider]  meclizine (ANTIVERT) 12.5 MG tablet Take 1 tablet (12.5 mg total) by mouth 3 (three) times daily as needed for dizziness. 11/08/15   Tharon Aquas, PA  naproxen (NAPROSYN) 500 MG tablet Take 1 tablet (500 mg total) by mouth 2 (two) times daily. 09/13/16   MabeLatanya Maudlin, MD      Allergies    Shellfish allergy    Review of Systems   Review of Systems  Physical Exam Updated Vital Signs BP (!) 134/96   Pulse 87   Temp 97.6 F (36.4 C) (Oral)   Resp 18   Ht 5\' 2"  (1.575 m)   Wt 74.4 kg   SpO2 100%   BMI 30.00 kg/m  Physical Exam Vitals and nursing note reviewed.  Constitutional:      General: She is not in acute distress.    Appearance: She is well-developed.  HENT:     Head: Normocephalic and atraumatic.     Right Ear: External ear normal.     Left Ear: External ear normal.     Nose: Nose normal.  Eyes:     Extraocular Movements: Extraocular movements intact.     Conjunctiva/sclera: Conjunctivae normal.     Pupils: Pupils are equal, round, and reactive to light.  Pulmonary:     Effort: Pulmonary effort is normal. No respiratory distress.  Abdominal:  General: There is distension.     Palpations: Abdomen is soft. There is no mass.     Tenderness: There is abdominal tenderness (Right lower quadrant). There is no guarding.  Genitourinary:    Comments: Chaperoned by RN Drue Flirt.  Small external hemorrhoid.  No rectal prolapse. Musculoskeletal:     Cervical back: Normal range of motion and neck supple.  Skin:    General: Skin is warm and dry.  Neurological:     Mental Status: She is alert and oriented to person, place, and time. Mental status is at baseline.  Psychiatric:        Mood and Affect: Mood normal.     ED Results / Procedures / Treatments   Labs (all labs ordered are listed, but only abnormal results are displayed) Labs Reviewed  COMPREHENSIVE METABOLIC  PANEL - Abnormal; Notable for the following components:      Result Value   Potassium 3.3 (*)    Glucose, Bld 143 (*)    Creatinine, Ser 1.04 (*)    Total Bilirubin 1.7 (*)    All other components within normal limits  URINALYSIS, ROUTINE W REFLEX MICROSCOPIC - Abnormal; Notable for the following components:   Ketones, ur 15 (*)    All other components within normal limits  LIPASE, BLOOD  CBC WITH DIFFERENTIAL/PLATELET    EKG None  Radiology CT ABDOMEN PELVIS W CONTRAST  Result Date: 04/01/2023 CLINICAL DATA:  Right lower quadrant pain. Episodes of diarrhea and constipation. EXAM: CT ABDOMEN AND PELVIS WITH CONTRAST TECHNIQUE: Multidetector CT imaging of the abdomen and pelvis was performed using the standard protocol following bolus administration of intravenous contrast. RADIATION DOSE REDUCTION: This exam was performed according to the departmental dose-optimization program which includes automated exposure control, adjustment of the mA and/or kV according to patient size and/or use of iterative reconstruction technique. CONTRAST:  OMNIPAQUE IOHEXOL 300 MG/ML  SOLN COMPARISON:  None Available. FINDINGS: Lower chest: There is some linear opacity lung bases likely scar or atelectasis. No pleural effusion. Heart is slightly enlarged. There is some questionable left ventricular wall hypertrophy. Coronary artery calcifications are seen. Trace pericardial fluid. Overall please correlate for other coronary risk factors. Hepatobiliary: Fatty liver infiltration. No space-occupying liver lesion. Gallbladder is nondilated. Patent portal vein. Pancreas: Unremarkable. No pancreatic ductal dilatation or surrounding inflammatory changes. Spleen: Normal in size without focal abnormality. Adrenals/Urinary Tract: Adrenal glands are preserved. The kidneys show several small less than 1 cm in size low-attenuation lesions which have benign features. Bosniak 1 and 2 lesions. No specific imaging follow-up.  Duplicated right renal collecting system. The ureters have normal course and caliber extending down to the urinary bladder. Stomach/Bowel: On this non oral contrast exam, large bowel has a normal course and caliber. There is scattered right-sided colonic stool. Normal appendix. Stomach and small bowel are nondilated. Vascular/Lymphatic: No significant vascular findings are present. No enlarged abdominal or pelvic lymph nodes. Reproductive: Enlarged lobular fibroid uterus. No separate adnexal mass Other: No free air or free fluid. Small fat containing umbilical hernia. Musculoskeletal: Scattered degenerative changes along the spine. Transitional lumbosacral junction. IMPRESSION: No bowel obstruction, free air or free fluid. Moderate right-sided colonic stool. Normal appendix. Fatty liver infiltration. Slight cardiac enlargement. There is some questionable left ventricular cardiac wall hypertrophy. Coronary artery calcifications are seen. Please correlate for other coronary risk factors Electronically Signed   By: Karen Kays M.D.   On: 04/01/2023 10:49    Procedures Procedures    Medications Ordered in ED Medications  iohexol (OMNIPAQUE) 300 MG/ML solution 100 mL (100 mLs Intravenous Contrast Given 04/01/23 2536)    ED Course/ Medical Decision Making/ A&P                                 Medical Decision Making Amount and/or Complexity of Data Reviewed Labs: ordered. Radiology: ordered.  Risk OTC drugs. Prescription drug management.   Kikuye Hefel is a 52 y.o. female with comorbidities that complicate the patient evaluation including hypertension and tubal ligation who presents to the emergency department with abdominal pain and constipation right lower quadrant pain  Initial Ddx:  Constipation, bowel obstruction, appendicitis  MDM/Course:  Patient presents to the emergency department with constipation for several days.  Also feels that she is having some abdominal swelling  and right lower quadrant pain and tenderness to palpation.  No rebound or guarding on exam.  CT of the abdomen pelvis obtained which does not show appendicitis or bowel obstruction.  Upon re-evaluation patient remained stable.  No nausea or vomiting in the emergency department.  Suspect that her symptoms are for constipation so we will have her take MiraLAX twice daily and use to collect suppositories until she starts having regular bowel movements.  She did also state that she felt like she had something protruding from her anus.  Did perform a chaperoned exam and appears that she does have external hemorrhoids.  This patient presents to the ED for concern of complaints listed in HPI, this involves an extensive number of treatment options, and is a complaint that carries with it a high risk of complications and morbidity. Disposition including potential need for admission considered.   Dispo: DC Home. Return precautions discussed including, but not limited to, those listed in the AVS. Allowed pt time to ask questions which were answered fully prior to dc.  Records reviewed Outpatient Clinic Notes The following labs were independently interpreted: Chemistry and show no acute abnormality I independently reviewed the following imaging with scope of interpretation limited to determining acute life threatening conditions related to emergency care: CT Abdomen/Pelvis and agree with the radiologist interpretation with the following exceptions: none I personally reviewed and interpreted cardiac monitoring: normal sinus rhythm  I have reviewed the patients home medications and made adjustments as needed  Portions of this note were generated with Dragon dictation software. Dictation errors may occur despite best attempts at proofreading.           Final Clinical Impression(s) / ED Diagnoses Final diagnoses:  Constipation, unspecified constipation type    Rx / DC Orders ED Discharge Orders           Ordered    polyethylene glycol (MIRALAX) 17 g packet  2 times daily        04/01/23 1119    bisacodyl (DULCOLAX) 10 MG suppository  As needed        04/01/23 1119              Rondel Baton, MD 04/01/23 1620

## 2023-08-12 ENCOUNTER — Other Ambulatory Visit: Payer: Self-pay | Admitting: Family Medicine

## 2023-08-12 DIAGNOSIS — Z1231 Encounter for screening mammogram for malignant neoplasm of breast: Secondary | ICD-10-CM

## 2023-08-19 ENCOUNTER — Ambulatory Visit
Admission: RE | Admit: 2023-08-19 | Discharge: 2023-08-19 | Disposition: A | Discharge status: Home / Self Care | Source: Ambulatory Visit | Attending: Family Medicine | Admitting: Family Medicine

## 2023-08-19 DIAGNOSIS — Z1231 Encounter for screening mammogram for malignant neoplasm of breast: Secondary | ICD-10-CM
